# Patient Record
Sex: Female | Born: 1990 | Race: Black or African American | Hispanic: No | Marital: Single | Smoking: Never smoker
Health system: Southern US, Community
[De-identification: ages and names within clinical notes are randomized; demographics above are authoritative.]

## PROBLEM LIST (undated history)

## (undated) DIAGNOSIS — J45909 Unspecified asthma, uncomplicated: Secondary | ICD-10-CM

---

## 2012-09-27 ENCOUNTER — Emergency Department (HOSPITAL_COMMUNITY): Payer: Medicaid - Out of State

## 2012-09-27 ENCOUNTER — Encounter (HOSPITAL_COMMUNITY): Payer: Self-pay

## 2012-09-27 ENCOUNTER — Emergency Department (HOSPITAL_COMMUNITY)
Admission: EM | Admit: 2012-09-27 | Discharge: 2012-09-28 | Disposition: A | Discharge status: Home / Self Care | Payer: Medicaid - Out of State | Attending: Emergency Medicine | Admitting: Emergency Medicine

## 2012-09-27 DIAGNOSIS — Z3202 Encounter for pregnancy test, result negative: Secondary | ICD-10-CM | POA: Insufficient documentation

## 2012-09-27 DIAGNOSIS — S39012A Strain of muscle, fascia and tendon of lower back, initial encounter: Secondary | ICD-10-CM

## 2012-09-27 DIAGNOSIS — Z79899 Other long term (current) drug therapy: Secondary | ICD-10-CM | POA: Insufficient documentation

## 2012-09-27 DIAGNOSIS — J45909 Unspecified asthma, uncomplicated: Secondary | ICD-10-CM | POA: Insufficient documentation

## 2012-09-27 DIAGNOSIS — X58XXXA Exposure to other specified factors, initial encounter: Secondary | ICD-10-CM | POA: Insufficient documentation

## 2012-09-27 DIAGNOSIS — Y929 Unspecified place or not applicable: Secondary | ICD-10-CM | POA: Insufficient documentation

## 2012-09-27 DIAGNOSIS — Y939 Activity, unspecified: Secondary | ICD-10-CM | POA: Insufficient documentation

## 2012-09-27 DIAGNOSIS — S335XXA Sprain of ligaments of lumbar spine, initial encounter: Secondary | ICD-10-CM | POA: Insufficient documentation

## 2012-09-27 HISTORY — DX: Unspecified asthma, uncomplicated: J45.909

## 2012-09-27 LAB — POCT PREGNANCY, URINE: Preg Test, Ur: NEGATIVE

## 2012-09-27 MED ORDER — PREDNISONE 20 MG PO TABS
60.0000 mg | ORAL_TABLET | Freq: Once | ORAL | Status: AC
  Administered 2012-09-27: 60 mg via ORAL
  Filled 2012-09-27: qty 3

## 2012-09-27 MED ORDER — HYDROCODONE-ACETAMINOPHEN 5-325 MG PO TABS: 1.0000 | ORAL_TABLET | Freq: Four times a day (QID) | ORAL | Status: DC | PRN

## 2012-09-27 MED ORDER — OXYCODONE-ACETAMINOPHEN 5-325 MG PO TABS
1.0000 | ORAL_TABLET | Freq: Once | ORAL | Status: AC
  Administered 2012-09-27: 1 via ORAL
  Filled 2012-09-27: qty 1

## 2012-09-27 MED ORDER — PREDNISONE 50 MG PO TABS: 50.0000 mg | ORAL_TABLET | Freq: Every day | ORAL | Status: DC

## 2012-09-27 NOTE — ED Provider Notes (Signed)
History     CSN: [id number]  Arrival date & time 09/27/12  2013   First MD Initiated Contact with Patient 09/27/12 2209      Chief Complaint  Patient presents with  . Back Pain    (Consider location/radiation/quality/duration/timing/severity/associated sxs/prior treatment) Patient is a 22 y.o. female presenting with back pain.  Back Pain    Patient is a 22 year old female with a chief complaint of low back pain.  For the past year the patient has had back back diffusely of her entire spine which she has seen a doctor for in the past that has told her to sleep on a traction bed.  Over the past two days the low back pain has become severe.  No recent trauma or change in behavior.  No fever, weight loss or night sweats.  Pain occurs mostly with movement is is alleviated by resting in one position.  Not any more severe at a certain time of day.  Patient denies abdominal pain, chest pain, weakness, nausea, vomiting, SOB, fever, headache or neck pain.  Past Medical History  Diagnosis Date  . Asthma     History reviewed. No pertinent past surgical history.  History reviewed. No pertinent family history.  History  Substance Use Topics  . Smoking status: Not on file  . Smokeless tobacco: Not on file  . Alcohol Use: No    OB History    Grav Para Term Preterm Abortions TAB SAB Ect Mult Living                  Review of Systems  Musculoskeletal: Positive for back pain.   All other systems negative except as documented in the HPI. All pertinent positives and negatives as reviewed in the HPI.  Allergies  Review of patient's allergies indicates no known allergies.  Home Medications   Current Outpatient Rx  Name  Route  Sig  Dispense  Refill  . ALBUTEROL SULFATE HFA 108 (90 BASE) MCG/ACT IN AERS   Inhalation   Inhale 2 puffs into the lungs every 6 (six) hours as needed. Asthma         . LORATADINE 10 MG PO TABS   Oral   Take 10 mg by mouth daily.           BP  130/86  Pulse 93  Temp 98.5 F (36.9 C) (Oral)  Resp 20  SpO2 100%  LMP 09/22/2012  Physical Exam  Nursing note and vitals reviewed. Constitutional: She is oriented to person, place, and time. She appears well-developed and well-nourished.  HENT:  Head: Normocephalic and atraumatic.  Mouth/Throat: Oropharynx is clear and moist.  Eyes: Pupils are equal, round, and reactive to light.  Neck: Normal range of motion. Neck supple.  Cardiovascular: Normal rate, regular rhythm and normal heart sounds.   Pulmonary/Chest: Effort normal and breath sounds normal.  Musculoskeletal:       Lumbar back: She exhibits tenderness. She exhibits normal range of motion.       Back:  Neurological: She is alert and oriented to person, place, and time. She has normal reflexes. No sensory deficit. She exhibits normal muscle tone. Coordination normal.  Skin: Skin is warm and dry. No rash noted.  Psychiatric: She has a normal mood and affect.    ED Course  Procedures (including critical care time)  The patient is advised to return here as needed. The patient has normal gait and reflexes MDM  Carlyle Dolly, PA-C 09/30/12 [phone]

## 2012-09-27 NOTE — ED Notes (Signed)
Pt complains of back pain for one year, now worse, states that she cracks her back often and it feels better, no injury

## 2012-10-01 NOTE — ED Provider Notes (Signed)
Medical screening examination/treatment/procedure(s) were performed by non-physician practitioner and as supervising physician I was immediately available for consultation/collaboration. Talibah Colasurdo, MD, FACEP   Sequan Auxier L Lekisha Mcghee, MD 10/01/12 1509 

## 2013-09-23 ENCOUNTER — Emergency Department (HOSPITAL_COMMUNITY)
Admission: EM | Admit: 2013-09-23 | Discharge: 2013-09-23 | Disposition: A | Discharge status: Home / Self Care | Payer: Medicaid - Out of State | Attending: Emergency Medicine | Admitting: Emergency Medicine

## 2013-09-23 ENCOUNTER — Encounter (HOSPITAL_COMMUNITY): Payer: Self-pay | Admitting: Emergency Medicine

## 2013-09-23 DIAGNOSIS — J45909 Unspecified asthma, uncomplicated: Secondary | ICD-10-CM | POA: Insufficient documentation

## 2013-09-23 DIAGNOSIS — L0231 Cutaneous abscess of buttock: Secondary | ICD-10-CM | POA: Insufficient documentation

## 2013-09-23 DIAGNOSIS — IMO0002 Reserved for concepts with insufficient information to code with codable children: Secondary | ICD-10-CM | POA: Insufficient documentation

## 2013-09-23 DIAGNOSIS — Z79899 Other long term (current) drug therapy: Secondary | ICD-10-CM | POA: Insufficient documentation

## 2013-09-23 DIAGNOSIS — L03317 Cellulitis of buttock: Principal | ICD-10-CM

## 2013-09-23 MED ORDER — SULFAMETHOXAZOLE-TRIMETHOPRIM 800-160 MG PO TABS: 2.0000 | ORAL_TABLET | Freq: Two times a day (BID) | ORAL | Status: AC

## 2013-09-23 MED ORDER — HYDROCODONE-ACETAMINOPHEN 5-325 MG PO TABS: 1.0000 | ORAL_TABLET | ORAL | Status: DC | PRN

## 2013-09-23 NOTE — Discharge Instructions (Signed)
Read the information below.  Use the prescribed medication as directed.  Please discuss all new medications with your pharmacist.  Do not take additional tylenol while taking the prescribed pain medication to avoid overdose.  You may return to the Emergency Department at any time for worsening condition or any new symptoms that concern you.  Please soak in a warm bath several times a day or use warm moist compresses every 2 hours to encourage drainage. If you develop redness, increased swelling, uncontrolled pain, or fevers greater than 100.4, return to the ER immediately for a recheck.     Abscess An abscess is an infected area that contains a collection of pus and debris.It can occur in almost any part of the body. An abscess is also known as a furuncle or boil. CAUSES  An abscess occurs when tissue gets infected. This can occur from blockage of oil or sweat glands, infection of hair follicles, or a minor injury to the skin. As the body tries to fight the infection, pus collects in the area and creates pressure under the skin. This pressure causes pain. People with weakened immune systems have difficulty fighting infections and get certain abscesses more often.  SYMPTOMS Usually an abscess develops on the skin and becomes a painful mass that is red, warm, and tender. If the abscess forms under the skin, you may feel a moveable soft area under the skin. Some abscesses break open (rupture) on their own, but most will continue to get worse without care. The infection can spread deeper into the body and eventually into the bloodstream, causing you to feel ill.  DIAGNOSIS  Your caregiver will take your medical history and perform a physical exam. A sample of fluid may also be taken from the abscess to determine what is causing your infection. TREATMENT  Your caregiver may prescribe antibiotic medicines to fight the infection. However, taking antibiotics alone usually does not cure an abscess. Your  caregiver may need to make a small cut (incision) in the abscess to drain the pus. In some cases, gauze is packed into the abscess to reduce pain and to continue draining the area. HOME CARE INSTRUCTIONS   Only take over-the-counter or prescription medicines for pain, discomfort, or fever as directed by your caregiver.  If you were prescribed antibiotics, take them as directed. Finish them even if you start to feel better.  If gauze is used, follow your caregiver's directions for changing the gauze.  To avoid spreading the infection:  Keep your draining abscess covered with a bandage.  Wash your hands well.  Do not share personal care items, towels, or whirlpools with others.  Avoid skin contact with others.  Keep your skin and clothes clean around the abscess.  Keep all follow-up appointments as directed by your caregiver. SEEK MEDICAL CARE IF:   You have increased pain, swelling, redness, fluid drainage, or bleeding.  You have muscle aches, chills, or a general ill feeling.  You have a fever. MAKE SURE YOU:   Understand these instructions.  Will watch your condition.  Will get help right away if you are not doing well or get worse. Document Released: 05/18/2005 Document Revised: 02/07/2012 Document Reviewed: 10/21/2011 [facility] Patient Information 2014 [place], [place].   Emergency Department Resource Guide 1) Find a Doctor and Pay Out of Pocket Although you won't have to find out who is covered by your insurance plan, it is a good idea to ask around and get recommendations. You will then need to call  the office and see if the doctor you have chosen will accept you as a new patient and what types of options they offer for patients who are self-pay. Some doctors offer discounts or will set up payment plans for their patients who do not have insurance, but you will need to ask so you aren't surprised when you get to your appointment.  2) Contact Your Local Health  Department Not all health departments have doctors that can see patients for sick visits, but many do, so it is worth a call to see if yours does. If you don't know where your local health department is, you can check in your phone book. The CDC also has a tool to help you locate your state's health department, and many state websites also have listings of all of their local health departments.  3) Find a [facility] If your illness is not likely to be very severe or complicated, you may want to try a walk in clinic. These are popping up all over the country in pharmacies, drugstores, and shopping centers. They're usually staffed by nurse practitioners or physician assistants that have been trained to treat common illnesses and complaints. They're usually fairly quick and inexpensive. However, if you have serious medical issues or chronic medical problems, these are probably not your best option.  No Primary Care Doctor: - Call Health Connect at  [phone] - they can help you locate a primary care doctor that  accepts your insurance, provides certain services, etc. - Physician Referral Service- [phone]  Chronic Pain Problems: Organization         Address  Phone   Notes  [facility]  [phone] Patients need to be referred by their primary care doctor.   Medication Assistance: Organization         Address  Phone   Notes  [facility] Medication [facility] [address] [place], [place] [address] [phone] --Must be a resident of [facility] -- Must have NO insurance coverage whatsoever (no Medicaid/ Medicare, etc.) -- The pt. MUST have a primary care doctor that directs their care regularly and follows them in the community   MedAssist  [phone]   [employer]  [phone]    Agencies that provide inexpensive medical care: Organization         Address  Phone   Notes  [place]  [phone]   [facility]    [phone]   [facility] [place], [place] [address] [phone]   [place] [address], [place] [phone]   Planned Parenthood    [phone]   [facility]    [phone]   [place] and [place] [address], [place] Phone:  [phone], Fax:  [phone] Hours of Operation:  9 am - 6 pm, M-F.  Also accepts Medicaid/Medicare and self-pay.  [facility] for [place] [address], [address], [place] Phone: [phone], Fax: [phone]. Hours of Operation:  8:30 am - 5:30 pm, M-F.  Also accepts Medicaid and self-pay.  [facility] [place] [address], [place] Phone: [phone]   [place], [place], [place] [phone], Ext. [phone] Mondays & Thursdays: 7-9 AM.  First 15 patients are seen on a first come, first serve basis.  [place] Providers:  Organization         Address  Phone   Notes  [facility] [address], [place] [phone] Also accepts self-pay patients.  [facility] [id number] [place], [place]  [phone]   [place], [address], [place] [phone]   [facility] [address] [place] [phone]   Lucianne Lei [address], [place]   [phone] Only accepts [place] Access [place] patients after they have their name applied to their card.   Self-Pay (no insurance) in [facility]:  Organization         Address  Phone   Notes  Sickle Cell Patients, [facility] Internal Medicine [place] [phone]   [facility]gent Care [place] [phone]   [facility] [place]  [place], [place],  [place] [phone]   [facility]/Dr. Osei-Bonsu  [address] [place] or [address], [place] [phone] Phone number for both [place] and [place] locations is the same.  Urgent Medical and [facility] [address] [place] [phone]   [facility] [address] [place] or [address] [phone]   [facility] [address] [place] [phone], phone; [phone], fax Sees patients 1st and 3rd Saturday of every month.  Must not qualify for public or private insurance (i.e. Medicaid, Medicare, [place] Health Choice, Veterans' Benefits)  Household income should be no more than 200% of the poverty level The clinic cannot treat you if you are pregnant or think you are pregnant  Sexually transmitted diseases are not treated at the clinic.    Dental Care: Organization         Address  Phone  Notes  [facility] Department of [facility] [place] [phone] Accepts children up to age 29 who are enrolled in [place] or [place]; pregnant women with a Medicaid card; and children who have applied for Medicaid or [place] Health Choice, but were declined, whose parents can pay a reduced fee at time of service.  [facility] Department of [facility]  [address], [place] [phone] Accepts children up to age 61 who are enrolled in [place] or [place]; pregnant women with a Medicaid card; and children who have applied for Medicaid or [place] Health Choice, but were declined, whose parents can pay a reduced fee at time of service.  [place] Adult Dental Access PROGRAM  [place] [phone] Patients are seen by appointment only. Walk-ins are not accepted. [place] will see patients 85 years of age and older. Monday - Tuesday (8am-5pm) Most Wednesdays  (8:30-5pm) $30 per visit, cash only  [facility] Adult Dental Access PROGRAM  [address], [facility] [phone] Patients are seen by appointment only. Walk-ins are not accepted. [place] will see patients 52 years of age and older. One Wednesday Evening (Monthly: Volunteer Based).  $30 per visit, cash only  [place]  [phone] for adults; Children under age 67, call Graduate Pediatric Dentistry at [phone]. Children aged 65-14, please call [phone] to request a  pediatric application.  Dental services are provided in all areas of dental care including fillings, crowns and bridges, complete and partial dentures, implants, gum treatment, root canals, and extractions. Preventive care is also provided. Treatment is provided to both adults and children. Patients are selected via a lottery and there is often a waiting list.   [facility] [address] [place]  [phone] [email or web address]   [facility] [address] [place], [place] [phone], Ext. [phone] Second and Fourth Thursday of each month, opens at 6:30 AM; Clinic ends at 9 AM.  Patients are seen on a first-come first-served basis, and a limited number are seen during each clinic.   [facility]  [address] Hillard Danker [place], [place] [phone]   Eligibility Requirements You must have lived in [place], [place], or [place] counties for at least the last three months.   You cannot be eligible for state or federal sponsored [employer], including [employer], [place], or [employer].   You generally cannot be eligible for healthcare insurance through your employer.    How to apply: Eligibility screenings are held every Tuesday and Wednesday afternoon from 1:00 pm until 4:00 pm. You do not need an appointment for the interview!  [facility] [address], [place], [place]   [place]  [place] Department  [place]  [phone]    Behavioral Health Resources in the Community: Intensive Outpatient Programs Organization         Address  Phone  Notes  [place]. [address], [place], [place] [phone]   [facility] Outpatient [address] [place], [place]   ADS: Alcohol & Drug Svcs [address] [place], [place]   [place] [address]  [place], [place] or [phone]   Substance Abuse Resources Organization         Address  Phone  Notes  Alcohol and Drug Services  [phone]   [place]  [phone]   The [place]   Chinita Pester  [phone]   Residential & Outpatient Substance Abuse Program  [phone]   Psychological Services Organization         Address  Phone  Notes  [facility]  [place]  [phone]   [place] [address], [place] or [phone]    Mobile Crisis Teams Organization         Address  Phone  Notes  [facility], Mobile Crisis Care Unit  [phone]   [facility]ive Psychotherapeutic Services  [address] [place], [place]   [place] Levels [address], [place] [phone]    Self-Help/Support Groups Organization         Address  Phone             Notes  [place]. of [place] - variety of support groups  [place] Call for more information  Narcotics Anonymous (NA), Caring Services [address], [employer] [place]  2 meetings at this location   Special educational needs teacher         Address  Phone  Notes  [facility]AP Residential Treatment [place],    [place]  Tatym Schermer [place]  [place], [place],  [place], [place]   [place], [place] [phone] Admissions: 8am-3pm M-F  Incentives Substance [place] [address],    [place], [place] [phone]   [facility] [address] [place], [place], [place]   [facility] [address]  [place], [place]   Insight Programs - Intensive Outpatient [address], [place], [place]   [facility] [place].,  [place], [place] [phone] or [phone]   Residential Treatment Services (RTS) [address], [place], [place] Accepts Medicaid  [facility] [place] [address],  [place] [phone] Substance Abuse/Addiction Treatment   [facility] Organization         Address  Phone  Notes  [facility]  [phone]   Domenic Schwab, PhD [address] Arlis Porta [place], [place]   [phone] or [phone]   Gandy Rotonda [place], [place] [phone]   [facility] [address] [place], [place] [phone] Insurance/Medicaid/sponsorship through [facility] and Families [address], [address] [place]                                    [place], [place] [phone] [place] [address] [place], [place] [phone]    Dr. Adele Schilder  [phone]   [facility] Dept. 1) [address] [place] 2) [place] 3)  [place] [address], [place] [phone]  [phone]   [place] [phone] or [phone] (After Hours)

## 2013-09-23 NOTE — Progress Notes (Signed)
   CARE MANAGEMENT ED NOTE 09/23/2013  Patient:  Shonna ChockWILLIAMS-HILL,Bracha   Account Number:  [id number]  Date Initiated:  09/23/2013  Documentation initiated by:  Edd ArbourGIBBS,Benjaman Artman  Subjective/Objective Assessment:   23 yr old medicaid out of state [place]     Subjective/Objective Assessment Detail:     Action/Plan:   EPIC updated   Action/Plan Detail:   Anticipated DC Date:  09/23/2013     Status Recommendation to Physician:   Result of Recommendation:    Other ED Services  Consult Working Plan    DC Planning Services  Outpatient Services - Pt will follow up  PCP issues  Other    Choice offered to / List presented to:            Status of service:  Completed, signed off  ED Comments:   ED Comments Detail:

## 2013-09-23 NOTE — ED Provider Notes (Signed)
CSN: [id number]     Arrival date & time 09/23/13  1400 History  This chart was scribed for non-physician practitioner, Trixie Dredge, PA-C working with Junius Argyle, MD by Greggory Stallion, ED scribe. This patient was seen in room WTR7/WTR7 and the patient's care was started at 2:26 PM.   Chief Complaint  Patient presents with  . Abscess   The history is provided by the patient. No language interpreter was used.   HPI Comments: Melissa Acosta is a 23 y.o. female who presents to the Emergency Department complaining of gradual onset, worsening tailbone pain that started 4 days ago. Pt states she has been doing sit ups on a hard surface and thinks that is was caused the pain. She states she started feeling a lump but denies any discharge. Pt has used generic icy hot with no relief. Movement or sitting down worsens the pain. Denies fever, chills, generalized body aches, constipation, bowel or bladder incontinence, weakness or numbness in legs, saddle anesthesia.   Past Medical History  Diagnosis Date  . Asthma    No past surgical history on file. No family history on file. History  Substance Use Topics  . Smoking status: Not on file  . Smokeless tobacco: Not on file  . Alcohol Use: No   OB History   Grav Para Term Preterm Abortions TAB SAB Ect Mult Living                 Review of Systems  Constitutional: Negative for fever and chills.  Gastrointestinal: Negative for constipation.  Genitourinary:       Negative for bowel or bladder incontinence.   Musculoskeletal: Positive for myalgias (tailbone).  Neurological: Negative for weakness and numbness.  All other systems reviewed and are negative.    Allergies  Review of patient's allergies indicates no known allergies.  Home Medications   Current Outpatient Rx  Name  Route  Sig  Dispense  Refill  . albuterol (PROVENTIL HFA;VENTOLIN HFA) 108 (90 BASE) MCG/ACT inhaler   Inhalation   Inhale 2 puffs into the lungs every 6  (six) hours as needed. Asthma         . HYDROcodone-acetaminophen (NORCO/VICODIN) 5-325 MG per tablet   Oral   Take 1 tablet by mouth every 6 (six) hours as needed for pain.   15 tablet   0   . loratadine (CLARITIN) 10 MG tablet   Oral   Take 10 mg by mouth daily.         . predniSONE (DELTASONE) 50 MG tablet   Oral   Take 1 tablet (50 mg total) by mouth daily.   5 tablet   0    BP 131/83  Pulse 89  Temp(Src) 98.1 F (36.7 C) (Oral)  Resp 18  SpO2 99%  LMP 09/14/2013  Physical Exam  Nursing note and vitals reviewed. Constitutional: She appears well-developed and well-nourished. No distress.  HENT:  Head: Normocephalic and atraumatic.  Neck: Neck supple.  Pulmonary/Chest: Effort normal.  Neurological: She is alert.  Skin: She is not diaphoretic.  Small induration and mild erythema of left buttock adjacent to pilonidal area.     ED Course  Procedures (including critical care time)  DIAGNOSTIC STUDIES: Oxygen Saturation is 99% on RA, normal by my interpretation.    COORDINATION OF CARE: 2:31 PM-Discussed treatment plan which includes ultrasound with pt at bedside and pt agreed to plan.  2:39 PM-Ultrasound preformed. Pt will be discharged with pain medication and antibiotics. Advised her  to return if abscess does not resolve.   Labs Review Labs Reviewed - No data to display Imaging Review No results found.  EKG Interpretation   None      Bedside ultrasound without apparent fluid collection.  MDM   1. Left buttock abscess    Pt with early abscess of left buttock vs pilonidal area.  No apparent abscess on bedside ultrasound.  Discussed options with patient - pt to d/c home with bactrim, norco, warm compresses/warm soaks.  Pt understands she may need to return for I&D.  I do not think I&D would be beneficial at this time. Discussed findings, treatment, and follow up  with patient.  Pt given return precautions.  Pt verbalizes understanding and agrees with  plan.      I personally performed the services described in this documentation, which was scribed in my presence. The recorded information has been reviewed and is accurate.   Trixie Dredgemily Ayris Carano, PA-C 09/23/13 1827

## 2013-09-23 NOTE — ED Notes (Signed)
Pt states she was working out Thursday, doing sit ups and crunches and felt some mild pain in tailbone area. Pt states she started feeling 10/10 pain in tailbone area over the weekend. Pt states she applied icyhot to affected area, which did not provide relief. Pt denies redness or swelling in affected area.

## 2013-09-27 NOTE — ED Provider Notes (Signed)
Medical screening examination/treatment/procedure(s) were performed by non-physician practitioner and as supervising physician I was immediately available for consultation/collaboration.  Toy Eisemann M Kahlen Morais, MD 09/27/13 1956 

## 2013-10-11 ENCOUNTER — Encounter (HOSPITAL_COMMUNITY): Payer: Self-pay | Admitting: Emergency Medicine

## 2013-10-11 ENCOUNTER — Emergency Department (HOSPITAL_COMMUNITY)
Admission: EM | Admit: 2013-10-11 | Discharge: 2013-10-11 | Disposition: A | Discharge status: Home / Self Care | Payer: Medicaid - Out of State | Attending: Emergency Medicine | Admitting: Emergency Medicine

## 2013-10-11 ENCOUNTER — Emergency Department (HOSPITAL_COMMUNITY): Payer: Medicaid - Out of State

## 2013-10-11 DIAGNOSIS — J45909 Unspecified asthma, uncomplicated: Secondary | ICD-10-CM | POA: Insufficient documentation

## 2013-10-11 DIAGNOSIS — J069 Acute upper respiratory infection, unspecified: Secondary | ICD-10-CM | POA: Insufficient documentation

## 2013-10-11 DIAGNOSIS — R110 Nausea: Secondary | ICD-10-CM

## 2013-10-11 DIAGNOSIS — Z79899 Other long term (current) drug therapy: Secondary | ICD-10-CM | POA: Insufficient documentation

## 2013-10-11 DIAGNOSIS — Z3202 Encounter for pregnancy test, result negative: Secondary | ICD-10-CM | POA: Insufficient documentation

## 2013-10-11 DIAGNOSIS — R112 Nausea with vomiting, unspecified: Secondary | ICD-10-CM | POA: Insufficient documentation

## 2013-10-11 LAB — URINALYSIS, ROUTINE W REFLEX MICROSCOPIC
BILIRUBIN URINE: NEGATIVE
Glucose, UA: NEGATIVE mg/dL
Ketones, ur: NEGATIVE mg/dL
Leukocytes, UA: NEGATIVE
Nitrite: NEGATIVE
PROTEIN: NEGATIVE mg/dL
Specific Gravity, Urine: 1.027 (ref 1.005–1.030)
UROBILINOGEN UA: 1 mg/dL (ref 0.0–1.0)
pH: 6 (ref 5.0–8.0)

## 2013-10-11 LAB — PREGNANCY, URINE: Preg Test, Ur: NEGATIVE

## 2013-10-11 LAB — URINE MICROSCOPIC-ADD ON

## 2013-10-11 MED ORDER — ONDANSETRON 4 MG PO TBDP: 4.0000 mg | ORAL_TABLET | Freq: Three times a day (TID) | ORAL | Status: DC | PRN

## 2013-10-11 MED ORDER — ONDANSETRON 4 MG PO TBDP
4.0000 mg | ORAL_TABLET | Freq: Once | ORAL | Status: AC
  Administered 2013-10-11: 4 mg via ORAL
  Filled 2013-10-11: qty 1

## 2013-10-11 NOTE — Discharge Instructions (Signed)
Nausea and Vomiting Nausea is a sick feeling that often comes before throwing up (vomiting). Vomiting is a reflex where stomach contents come out of your mouth. Vomiting can cause severe loss of body fluids (dehydration). Children and elderly adults can become dehydrated quickly, especially if they also have diarrhea. Nausea and vomiting are symptoms of a condition or disease. It is important to find the cause of your symptoms. CAUSES   Direct irritation of the stomach lining. This irritation can result from increased acid production (gastroesophageal reflux disease), infection, food poisoning, taking certain medicines (such as nonsteroidal anti-inflammatory drugs), alcohol use, or tobacco use.  Signals from the brain.These signals could be caused by a headache, heat exposure, an inner ear disturbance, increased pressure in the brain from injury, infection, a tumor, or a concussion, pain, emotional stimulus, or metabolic problems.  An obstruction in the gastrointestinal tract (bowel obstruction).  Illnesses such as diabetes, hepatitis, gallbladder problems, appendicitis, kidney problems, cancer, sepsis, atypical symptoms of a heart attack, or eating disorders.  Medical treatments such as chemotherapy and radiation.  Receiving medicine that makes you sleep (general anesthetic) during surgery. DIAGNOSIS Your caregiver may ask for tests to be done if the problems do not improve after a few days. Tests may also be done if symptoms are severe or if the reason for the nausea and vomiting is not clear. Tests may include:  Urine tests.  Blood tests.  Stool tests.  Cultures (to look for evidence of infection).  X-rays or other imaging studies. Test results can help your caregiver make decisions about treatment or the need for additional tests. TREATMENT You need to stay well hydrated. Drink frequently but in small amounts.You may wish to drink water, sports drinks, clear broth, or eat frozen  ice pops or gelatin dessert to help stay hydrated.When you eat, eating slowly may help prevent nausea.There are also some antinausea medicines that may help prevent nausea. HOME CARE INSTRUCTIONS   Take all medicine as directed by your caregiver.  If you do not have an appetite, do not force yourself to eat. However, you must continue to drink fluids.  If you have an appetite, eat a normal diet unless your caregiver tells you differently.  Eat a variety of complex carbohydrates (rice, wheat, potatoes, bread), lean meats, yogurt, fruits, and vegetables.  Avoid high-fat foods because they are more difficult to digest.  Drink enough water and fluids to keep your urine clear or pale yellow.  If you are dehydrated, ask your caregiver for specific rehydration instructions. Signs of dehydration may include:  Severe thirst.  Dry lips and mouth.  Dizziness.  Dark urine.  Decreasing urine frequency and amount.  Confusion.  Rapid breathing or pulse. SEEK IMMEDIATE MEDICAL CARE IF:   You have blood or brown flecks (like coffee grounds) in your vomit.  You have black or bloody stools.  You have a severe headache or stiff neck.  You are confused.  You have severe abdominal pain.  You have chest pain or trouble breathing.  You do not urinate at least once every 8 hours.  You develop cold or clammy skin.  You continue to vomit for longer than 24 to 48 hours.  You have a fever. MAKE SURE YOU:   Understand these instructions.  Will watch your condition.  Will get help right away if you are not doing well or get worse. Document Released: 08/08/2005 Document Revised: 10/31/2011 Document Reviewed: 01/05/2011 [facility] Patient Information 2014 [place], [place].  Upper Respiratory Infection, Adult  An upper respiratory infection (URI) is also sometimes known as the common cold. The upper respiratory tract includes the nose, sinuses, throat, trachea, and bronchi. Bronchi are the  airways leading to the lungs. Most people improve within 1 week, but symptoms can last up to 2 weeks. A residual cough may last even longer.  CAUSES Many different viruses can infect the tissues lining the upper respiratory tract. The tissues become irritated and inflamed and often become very moist. Mucus production is also common. A cold is contagious. You can easily spread the virus to others by oral contact. This includes kissing, sharing a glass, coughing, or sneezing. Touching your mouth or nose and then touching a surface, which is then touched by another person, can also spread the virus. SYMPTOMS  Symptoms typically develop 1 to 3 days after you come in contact with a cold virus. Symptoms vary from person to person. They may include:  Runny nose.  Sneezing.  Nasal congestion.  Sinus irritation.  Sore throat.  Loss of voice (laryngitis).  Cough.  Fatigue.  Muscle aches.  Loss of appetite.  Headache.  Low-grade fever. DIAGNOSIS  You might diagnose your own cold based on familiar symptoms, since most people get a cold 2 to 3 times a year. Your caregiver can confirm this based on your exam. Most importantly, your caregiver can check that your symptoms are not due to another disease such as strep throat, sinusitis, pneumonia, asthma, or epiglottitis. Blood tests, throat tests, and X-rays are not necessary to diagnose a common cold, but they may sometimes be helpful in excluding other more serious diseases. Your caregiver will decide if any further tests are required. RISKS AND COMPLICATIONS  You may be at risk for a more severe case of the common cold if you smoke cigarettes, have chronic heart disease (such as heart failure) or lung disease (such as asthma), or if you have a weakened immune system. The very young and very old are also at risk for more serious infections. Bacterial sinusitis, middle ear infections, and bacterial pneumonia can complicate the common cold. The common  cold can worsen asthma and chronic obstructive pulmonary disease (COPD). Sometimes, these complications can require emergency medical care and may be life-threatening. PREVENTION  The best way to protect against getting a cold is to practice good hygiene. Avoid oral or hand contact with people with cold symptoms. Wash your hands often if contact occurs. There is no clear evidence that vitamin C, vitamin E, echinacea, or exercise reduces the chance of developing a cold. However, it is always recommended to get plenty of rest and practice good nutrition. TREATMENT  Treatment is directed at relieving symptoms. There is no cure. Antibiotics are not effective, because the infection is caused by a virus, not by bacteria. Treatment may include:  Increased fluid intake. Sports drinks offer valuable electrolytes, sugars, and fluids.  Breathing heated mist or steam (vaporizer or shower).  Eating chicken soup or other clear broths, and maintaining good nutrition.  Getting plenty of rest.  Using gargles or lozenges for comfort.  Controlling fevers with ibuprofen or acetaminophen as directed by your caregiver.  Increasing usage of your inhaler if you have asthma. Zinc gel and zinc lozenges, taken in the first 24 hours of the common cold, can shorten the duration and lessen the severity of symptoms. Pain medicines may help with fever, muscle aches, and throat pain. A variety of non-prescription medicines are available to treat congestion and runny nose. Your caregiver can make recommendations  and may suggest nasal or lung inhalers for other symptoms.  HOME CARE INSTRUCTIONS   Only take over-the-counter or prescription medicines for pain, discomfort, or fever as directed by your caregiver.  Use a warm mist humidifier or inhale steam from a shower to increase air moisture. This may keep secretions moist and make it easier to breathe.  Drink enough water and fluids to keep your urine clear or pale  yellow.  Rest as needed.  Return to work when your temperature has returned to normal or as your caregiver advises. You may need to stay home longer to avoid infecting others. You can also use a face mask and careful hand washing to prevent spread of the virus. SEEK MEDICAL CARE IF:   After the first few days, you feel you are getting worse rather than better.  You need your caregiver's advice about medicines to control symptoms.  You develop chills, worsening shortness of breath, or brown or red sputum. These may be signs of pneumonia.  You develop yellow or brown nasal discharge or pain in the face, especially when you bend forward. These may be signs of sinusitis.  You develop a fever, swollen neck glands, pain with swallowing, or white areas in the back of your throat. These may be signs of strep throat. SEEK IMMEDIATE MEDICAL CARE IF:   You have a fever.  You develop severe or persistent headache, ear pain, sinus pain, or chest pain.  You develop wheezing, a prolonged cough, cough up blood, or have a change in your usual mucus (if you have chronic lung disease).  You develop sore muscles or a stiff neck. Document Released: 02/01/2001 Document Revised: 10/31/2011 Document Reviewed: 12/10/2010 [facility] Patient Information 2014 [place], [place].

## 2013-10-11 NOTE — ED Notes (Signed)
Pt c/o nasal congestion and cough x 2 weeks and vomiting x 3 days.  Denies pain.  Pt reports that she has taken several OTC medication w/o relief.

## 2013-10-11 NOTE — ED Provider Notes (Signed)
CSN: [id number]     Arrival date & time 10/11/13  1230 History   First MD Initiated Contact with Patient 10/11/13 1302     Chief Complaint  Patient presents with  . Nasal Congestion  . Emesis  . Cough      HPI  Patient presents with "really sick".  Runny nose and a cough and sinus congestion for last week.  She then started vomiting 2 days ago as well as yesterday vomited today and presents here denies urinary symptoms of a pulmonary symptoms denies pregnancy.  Past Medical History  Diagnosis Date  . Asthma    History reviewed. No pertinent past surgical history. History reviewed. No pertinent family history. History  Substance Use Topics  . Smoking status: Never Smoker   . Smokeless tobacco: Not on file  . Alcohol Use: Yes     Comment: occ   OB History   Grav Para Term Preterm Abortions TAB SAB Ect Mult Living                 Review of Systems  Constitutional: Negative for fever, chills, diaphoresis, appetite change and fatigue.  HENT: Positive for congestion, postnasal drip and rhinorrhea. Negative for mouth sores, sore throat and trouble swallowing.   Eyes: Negative for visual disturbance.  Respiratory: Negative for cough, chest tightness, shortness of breath and wheezing.   Cardiovascular: Negative for chest pain.  Gastrointestinal: Positive for nausea and vomiting. Negative for abdominal pain, diarrhea and abdominal distention.  Endocrine: Negative for polydipsia, polyphagia and polyuria.  Genitourinary: Negative for dysuria, frequency and hematuria.  Musculoskeletal: Negative for gait problem.  Skin: Negative for color change, pallor and rash.  Neurological: Negative for dizziness, syncope, light-headedness and headaches.  Hematological: Does not bruise/bleed easily.  Psychiatric/Behavioral: Negative for behavioral problems and confusion.      Allergies  Review of patient's allergies indicates no known allergies.  Home Medications   Current Outpatient Rx   Name  Route  Sig  Dispense  Refill  . albuterol (PROVENTIL HFA;VENTOLIN HFA) 108 (90 BASE) MCG/ACT inhaler   Inhalation   Inhale 2 puffs into the lungs every 6 (six) hours as needed. Asthma         . Camphor-Eucalyptus-Menthol (VICKS VAPORUB EX)   Apply externally   Apply 1 application topically every 2 (two) hours as needed (congestion).         . DM-Doxylamine-Acetaminophen (NYQUIL COLD & FLU PO)   Oral   Take 15-30 mLs by mouth every 4 (four) hours as needed (cold like symptoms).         Marland Kitchen HYDROcodone-acetaminophen (NORCO/VICODIN) 5-325 MG per tablet   Oral   Take 1 tablet by mouth every 4 (four) hours as needed.   15 tablet   0   . Norethindrone-Ethinyl Estradiol-Fe Biphas (LO LOESTRIN FE) 1 MG-10 MCG / 10 MCG tablet   Oral   Take 1 tablet by mouth daily.         Marland Kitchen Phenylephrine-DM-GG-APAP (TYLENOL COLD/FLU SEVERE) 5-10-200-325 MG TABS   Oral   Take 1 tablet by mouth every 4 (four) hours as needed (cold symptoms).         . ondansetron (ZOFRAN ODT) 4 MG disintegrating tablet   Oral   Take 1 tablet (4 mg total) by mouth every 8 (eight) hours as needed for nausea.   6 tablet   0    BP 130/91  Pulse 86  Temp(Src) 98.2 F (36.8 C) (Oral)  Resp 16  Ht 5'  4" (1.626 m)  Wt 167 lb (75.751 kg)  BMI 28.65 kg/m2  SpO2 100%  LMP 09/14/2013 Physical Exam  Constitutional: She is oriented to person, place, and time. She appears well-developed and well-nourished. No distress.  HENT:  Head: Normocephalic.  Eyes: Conjunctivae are normal. Pupils are equal, round, and reactive to light. No scleral icterus.  Neck: Normal range of motion. Neck supple. No thyromegaly present.  Cardiovascular: Normal rate and regular rhythm.  Exam reveals no gallop and no friction rub.   No murmur heard. Pulmonary/Chest: Effort normal and breath sounds normal. No respiratory distress. She has no wheezes. She has no rales.  Abdominal: Soft. Bowel sounds are normal. She exhibits no  distension. There is no tenderness. There is no rebound.  Musculoskeletal: Normal range of motion.  Neurological: She is alert and oriented to person, place, and time.  Skin: Skin is warm and dry. No rash noted.  Psychiatric: She has a normal mood and affect. Her behavior is normal.    ED Course  Procedures (including critical care time) Labs Review Labs Reviewed  URINALYSIS, ROUTINE W REFLEX MICROSCOPIC - Abnormal; Notable for the following:    APPearance CLOUDY (*)    Hgb urine dipstick TRACE (*)    All other components within normal limits  URINE MICROSCOPIC-ADD ON - Abnormal; Notable for the following:    Squamous Epithelial / LPF FEW (*)    All other components within normal limits  PREGNANCY, URINE   Imaging Review Dg Chest 2 View  10/11/2013   CLINICAL DATA:  Cough and congestion  EXAM: CHEST  2 VIEW  COMPARISON:  None.  FINDINGS: Lungs are clear. Heart size and pulmonary vascularity are normal. No adenopathy. There is mild upper thoracic levoscoliosis.  IMPRESSION: No edema or consolidation.   Electronically Signed   By: Bretta BangWilliam  Woodruff M.D.   On: 10/11/2013 13:39    EKG Interpretation   None       MDM   Final diagnoses:  Nausea  Upper respiratory infection    Normal ureters normal x-ray. Normal exam. No vomiting. Plan is discharge home    Rolland PorterMark Alasha Mcguinness, MD 10/11/13 1459

## 2013-10-22 ENCOUNTER — Encounter (HOSPITAL_COMMUNITY): Payer: Self-pay | Admitting: Emergency Medicine

## 2013-10-22 ENCOUNTER — Emergency Department (HOSPITAL_COMMUNITY)
Admission: EM | Admit: 2013-10-22 | Discharge: 2013-10-23 | Disposition: A | Discharge status: Home / Self Care | Payer: Medicaid - Out of State | Attending: Emergency Medicine | Admitting: Emergency Medicine

## 2013-10-22 DIAGNOSIS — R059 Cough, unspecified: Secondary | ICD-10-CM | POA: Diagnosis present

## 2013-10-22 DIAGNOSIS — R05 Cough: Secondary | ICD-10-CM

## 2013-10-22 DIAGNOSIS — J45909 Unspecified asthma, uncomplicated: Secondary | ICD-10-CM | POA: Diagnosis not present

## 2013-10-22 DIAGNOSIS — Z79899 Other long term (current) drug therapy: Secondary | ICD-10-CM | POA: Diagnosis not present

## 2013-10-22 DIAGNOSIS — R071 Chest pain on breathing: Secondary | ICD-10-CM | POA: Insufficient documentation

## 2013-10-22 DIAGNOSIS — R0789 Other chest pain: Secondary | ICD-10-CM

## 2013-10-22 NOTE — ED Notes (Addendum)
Pt reports cough x 2 weeks that is productive and was seen here and dx with URI. Denies n/v/d and being SOB. Skin warm and dry. NAD. Alert and oriented x 4. Patient has left rib cage pain that hurts when coughing.

## 2013-10-23 ENCOUNTER — Emergency Department (HOSPITAL_COMMUNITY): Payer: Medicaid - Out of State

## 2013-10-23 LAB — CBC WITH DIFFERENTIAL/PLATELET
Basophils Absolute: 0 10*3/uL (ref 0.0–0.1)
Basophils Relative: 0 % (ref 0–1)
EOS ABS: 0.2 10*3/uL (ref 0.0–0.7)
Eosinophils Relative: 2 % (ref 0–5)
HCT: 36.7 % (ref 36.0–46.0)
HEMOGLOBIN: 12.1 g/dL (ref 12.0–15.0)
LYMPHS ABS: 3.8 10*3/uL (ref 0.7–4.0)
LYMPHS PCT: 41 % (ref 12–46)
MCH: 28.7 pg (ref 26.0–34.0)
MCHC: 33 g/dL (ref 30.0–36.0)
MCV: 87.2 fL (ref 78.0–100.0)
MONOS PCT: 6 % (ref 3–12)
Monocytes Absolute: 0.5 10*3/uL (ref 0.1–1.0)
NEUTROS PCT: 52 % (ref 43–77)
Neutro Abs: 4.8 10*3/uL (ref 1.7–7.7)
Platelets: 357 10*3/uL (ref 150–400)
RBC: 4.21 MIL/uL (ref 3.87–5.11)
RDW: 12.4 % (ref 11.5–15.5)
WBC: 9.3 10*3/uL (ref 4.0–10.5)

## 2013-10-23 LAB — BASIC METABOLIC PANEL
BUN: 11 mg/dL (ref 6–23)
CO2: 28 meq/L (ref 19–32)
Calcium: 9.4 mg/dL (ref 8.4–10.5)
Chloride: 100 mEq/L (ref 96–112)
Creatinine, Ser: 0.64 mg/dL (ref 0.50–1.10)
GFR calc Af Amer: >90 mL/min (ref >90)
GLUCOSE: 81 mg/dL (ref 70–99)
POTASSIUM: 3.4 meq/L — AB (ref 3.7–5.3)
SODIUM: 140 meq/L (ref 137–147)

## 2013-10-23 NOTE — ED Provider Notes (Signed)
CSN: [id number]     Arrival date & time 10/22/13  2247 History   First MD Initiated Contact with Patient 10/23/13 0051     Chief Complaint  Patient presents with  . Cough     (Consider location/radiation/quality/duration/timing/severity/associated sxs/prior Treatment) HPI Comments: She has had L lateral chest wall pain w/ cough for two weeks. Today she coughed and felt a "pop" in her left lateral chest.   Patient is a 23 y.o. female presenting with cough. The history is provided by the patient. No language interpreter was used.  Cough Cough characteristics:  Dry Severity:  Moderate Duration:  2 weeks Timing:  Constant Progression:  Unchanged Chronicity:  New Smoker: no   Relieved by:  Nothing Worsened by:  Nothing tried Ineffective treatments:  Rest Associated symptoms: chest pain   Associated symptoms: no chills, no diaphoresis, no eye discharge, no fever, no headaches, no myalgias, no rash, no rhinorrhea, no shortness of breath, no sinus congestion, no sore throat and no wheezing   Associated symptoms comment:  No LE edema, no SOB Chest pain:    Quality:  Sharp   Severity:  Moderate   Duration:  2 days   Timing:  Intermittent   Progression:  Worsening   Chronicity:  New Risk factors: recent infection     Past Medical History  Diagnosis Date  . Asthma    History reviewed. No pertinent past surgical history. No family history on file. History  Substance Use Topics  . Smoking status: Never Smoker   . Smokeless tobacco: Not on file  . Alcohol Use: Yes     Comment: occ   OB History   Grav Para Term Preterm Abortions TAB SAB Ect Mult Living                 Review of Systems  Constitutional: Negative for fever, chills, diaphoresis, activity change, appetite change and fatigue.  HENT: Negative for congestion, facial swelling, rhinorrhea and sore throat.   Eyes: Negative for photophobia and discharge.  Respiratory: Positive for cough. Negative for chest tightness,  shortness of breath and wheezing.   Cardiovascular: Positive for chest pain. Negative for palpitations and leg swelling.  Gastrointestinal: Negative for nausea, vomiting, abdominal pain and diarrhea.  Endocrine: Negative for polydipsia and polyuria.  Genitourinary: Negative for dysuria, frequency, difficulty urinating and pelvic pain.  Musculoskeletal: Negative for arthralgias, back pain, myalgias, neck pain and neck stiffness.  Skin: Negative for color change, rash and wound.  Allergic/Immunologic: Negative for immunocompromised state.  Neurological: Negative for facial asymmetry, weakness, numbness and headaches.  Hematological: Does not bruise/bleed easily.  Psychiatric/Behavioral: Negative for confusion and agitation.      Allergies  Review of patient's allergies indicates no known allergies.  Home Medications   Current Outpatient Rx  Name  Route  Sig  Dispense  Refill  . albuterol (PROVENTIL HFA;VENTOLIN HFA) 108 (90 BASE) MCG/ACT inhaler   Inhalation   Inhale 2 puffs into the lungs every 6 (six) hours as needed. Asthma         . Camphor-Eucalyptus-Menthol (VICKS VAPORUB EX)   Apply externally   Apply 1 application topically every 2 (two) hours as needed (congestion).         . Chlorphen-Pseudoephed-APAP (THERAFLU FLU/COLD PO)   Oral   Take 5 mLs by mouth every 8 (eight) hours as needed (cough).         . DM-Doxylamine-Acetaminophen (NYQUIL COLD & FLU PO)   Oral   Take 15-30 mLs by mouth  every 4 (four) hours as needed (cold like symptoms).         Marland Kitchen guaiFENesin (MUCINEX) 600 MG 12 hr tablet   Oral   Take 600 mg by mouth 2 (two) times daily as needed for cough or to loosen phlegm.         . Norethindrone-Ethinyl Estradiol-Fe Biphas (LO LOESTRIN FE) 1 MG-10 MCG / 10 MCG tablet   Oral   Take 1 tablet by mouth daily.         Marland Kitchen Phenylephrine-DM-GG-APAP (TYLENOL COLD/FLU SEVERE) 5-10-200-325 MG TABS   Oral   Take 1 tablet by mouth every 4 (four) hours as  needed (cold symptoms).          BP 125/83  Pulse 90  Temp(Src) 98.3 F (36.8 C) (Oral)  Resp 16  SpO2 99%  LMP 09/14/2013 Physical Exam  Constitutional: She is oriented to person, place, and time. She appears well-developed and well-nourished. No distress.  HENT:  Head: Normocephalic and atraumatic.  Mouth/Throat: No oropharyngeal exudate.  Eyes: Pupils are equal, round, and reactive to light.  Neck: Normal range of motion. Neck supple.  Cardiovascular: Normal rate, regular rhythm and normal heart sounds.  Exam reveals no gallop and no friction rub.   No murmur heard. Pulmonary/Chest: Effort normal and breath sounds normal. No respiratory distress. She has no wheezes. She has no rales. She exhibits bony tenderness.    Abdominal: Soft. Bowel sounds are normal. She exhibits no distension and no mass. There is no tenderness. There is no rebound and no guarding.  Musculoskeletal: Normal range of motion. She exhibits no edema and no tenderness.  Neurological: She is alert and oriented to person, place, and time.  Skin: Skin is warm and dry.  Psychiatric: She has a normal mood and affect.    ED Course  Procedures (including critical care time) Labs Review Labs Reviewed  BASIC METABOLIC PANEL - Abnormal; Notable for the following:    Potassium 3.4 (*)    All other components within normal limits  CBC WITH DIFFERENTIAL   Imaging Review Dg Chest 2 View  10/23/2013   CLINICAL DATA:  Shortness of breath and left anterior chest pain.  EXAM: CHEST  2 VIEW  COMPARISON:  DG CHEST 2 VIEW dated 10/11/2013  FINDINGS: Cardiomediastinal silhouette is unremarkable. Minimal strandy densities in left lung base with a mildly elevated left hemidiaphragm. The lungs are otherwise clear without pleural effusions or focal consolidations. Trachea projects midline and there is no pneumothorax. Soft tissue planes and included osseous structures are non-suspicious. Upper thoracic dextroscoliosis. Air-filled  colon.  IMPRESSION: Minimal left lung base atelectasis.   Electronically Signed   By: Awilda Metro   On: 10/23/2013 01:13     EKG Interpretation None      MDM   Final diagnoses:  Cough  Chest wall pain    Pt is a 23 y.o. female with Pmhx as above who presents with about 2 weeks of cough w/ L lateral chest wall pain only w/ cough, palpation or deep breathing. Today she coughed, felt a pop and was concerning for a rib fx. No fever, SOB, leg pain or swelling. VSS, pt in NAD.  +ttp L lateral chest wall CXR with minimal L lung base atelectasis. No bony changes. Doubt pna, PE, ACS. Suspect MSK pain due to frequent coughing. Rec scheduled NSAIDs. Return precautions given for new or worsening symptoms including fever, SOB, leg pain/swelling.          Aundra Millet E  Micheline Maze, MD 10/23/13 2047

## 2013-10-23 NOTE — Discharge Instructions (Signed)
Chest Wall Pain Chest wall pain is pain felt in or around the chest bones and muscles. It may take up to 6 weeks to get better. It may take longer if you are active. Chest wall pain can happen on its own. Other times, things like germs, injury, coughing, or exercise can cause the pain. HOME CARE   Avoid activities that make you tired or cause pain. Try not to use your chest, belly (abdominal), or side muscles. Do not use heavy weights.  Put ice on the sore area.  Put ice in a plastic bag.  Place a towel between your skin and the bag.  Leave the ice on for 15-20 minutes for the first 2 days.  Only take medicine as told by your doctor. GET HELP RIGHT AWAY IF:   You have more pain or are very uncomfortable.  You have a fever.  Your chest pain gets worse.  You have new problems.  You feel sick to your stomach (nauseous) or throw up (vomit).  You start to sweat or feel lightheaded.  You have a cough with mucus (phlegm).  You cough up blood. MAKE SURE YOU:   Understand these instructions.  Will watch your condition.  Will get help right away if you are not doing well or get worse. Document Released: 01/25/2008 Document Revised: 10/31/2011 Document Reviewed: 04/04/2011 [facility] Patient Information 2014 [place] ShoreExitCare, [place]LL[place]. Cough, Adult  A cough is a reflex that helps clear your throat and airways. It can help heal the body or may be a reaction to an irritated airway. A cough may only last 2 or 3 weeks (acute) or may last more than 8 weeks (chronic).  CAUSES Acute cough:  Viral or bacterial infections. Chronic cough:  Infections.  Allergies.  Asthma.  Post-nasal drip.  Smoking.  Heartburn or acid reflux.  Some medicines.  Chronic lung problems (COPD).  Cancer. SYMPTOMS   Cough.  Fever.  Chest pain.  Increased breathing rate.  High-pitched whistling sound when breathing (wheezing).  Colored mucus that you cough up (sputum). TREATMENT   A bacterial  cough may be treated with antibiotic medicine.  A viral cough must run its course and will not respond to antibiotics.  Your caregiver may recommend other treatments if you have a chronic cough. HOME CARE INSTRUCTIONS   Only take over-the-counter or prescription medicines for pain, discomfort, or fever as directed by your caregiver. Use cough suppressants only as directed by your caregiver.  Use a cold steam vaporizer or humidifier in your bedroom or home to help loosen secretions.  Sleep in a semi-upright position if your cough is worse at night.  Rest as needed.  Stop smoking if you smoke. SEEK IMMEDIATE MEDICAL CARE IF:   You have pus in your sputum.  Your cough starts to worsen.  You cannot control your cough with suppressants and are losing sleep.  You begin coughing up blood.  You have difficulty breathing.  You develop pain which is getting worse or is uncontrolled with medicine.  You have a fever. MAKE SURE YOU:   Understand these instructions.  Will watch your condition.  Will get help right away if you are not doing well or get worse. Document Released: 02/04/2011 Document Revised: 10/31/2011 Document Reviewed: 02/04/2011 [facility] Patient Information 2014 [place]ExitCare, MarylandLLC.

## 2014-02-21 IMAGING — CR DG CHEST 2V
2 series · 2 of 2 positions shown · non-contrast
Comparison: None.

CLINICAL DATA: Cough and congestion

EXAM:
CHEST  2 VIEW

[w chest pa]
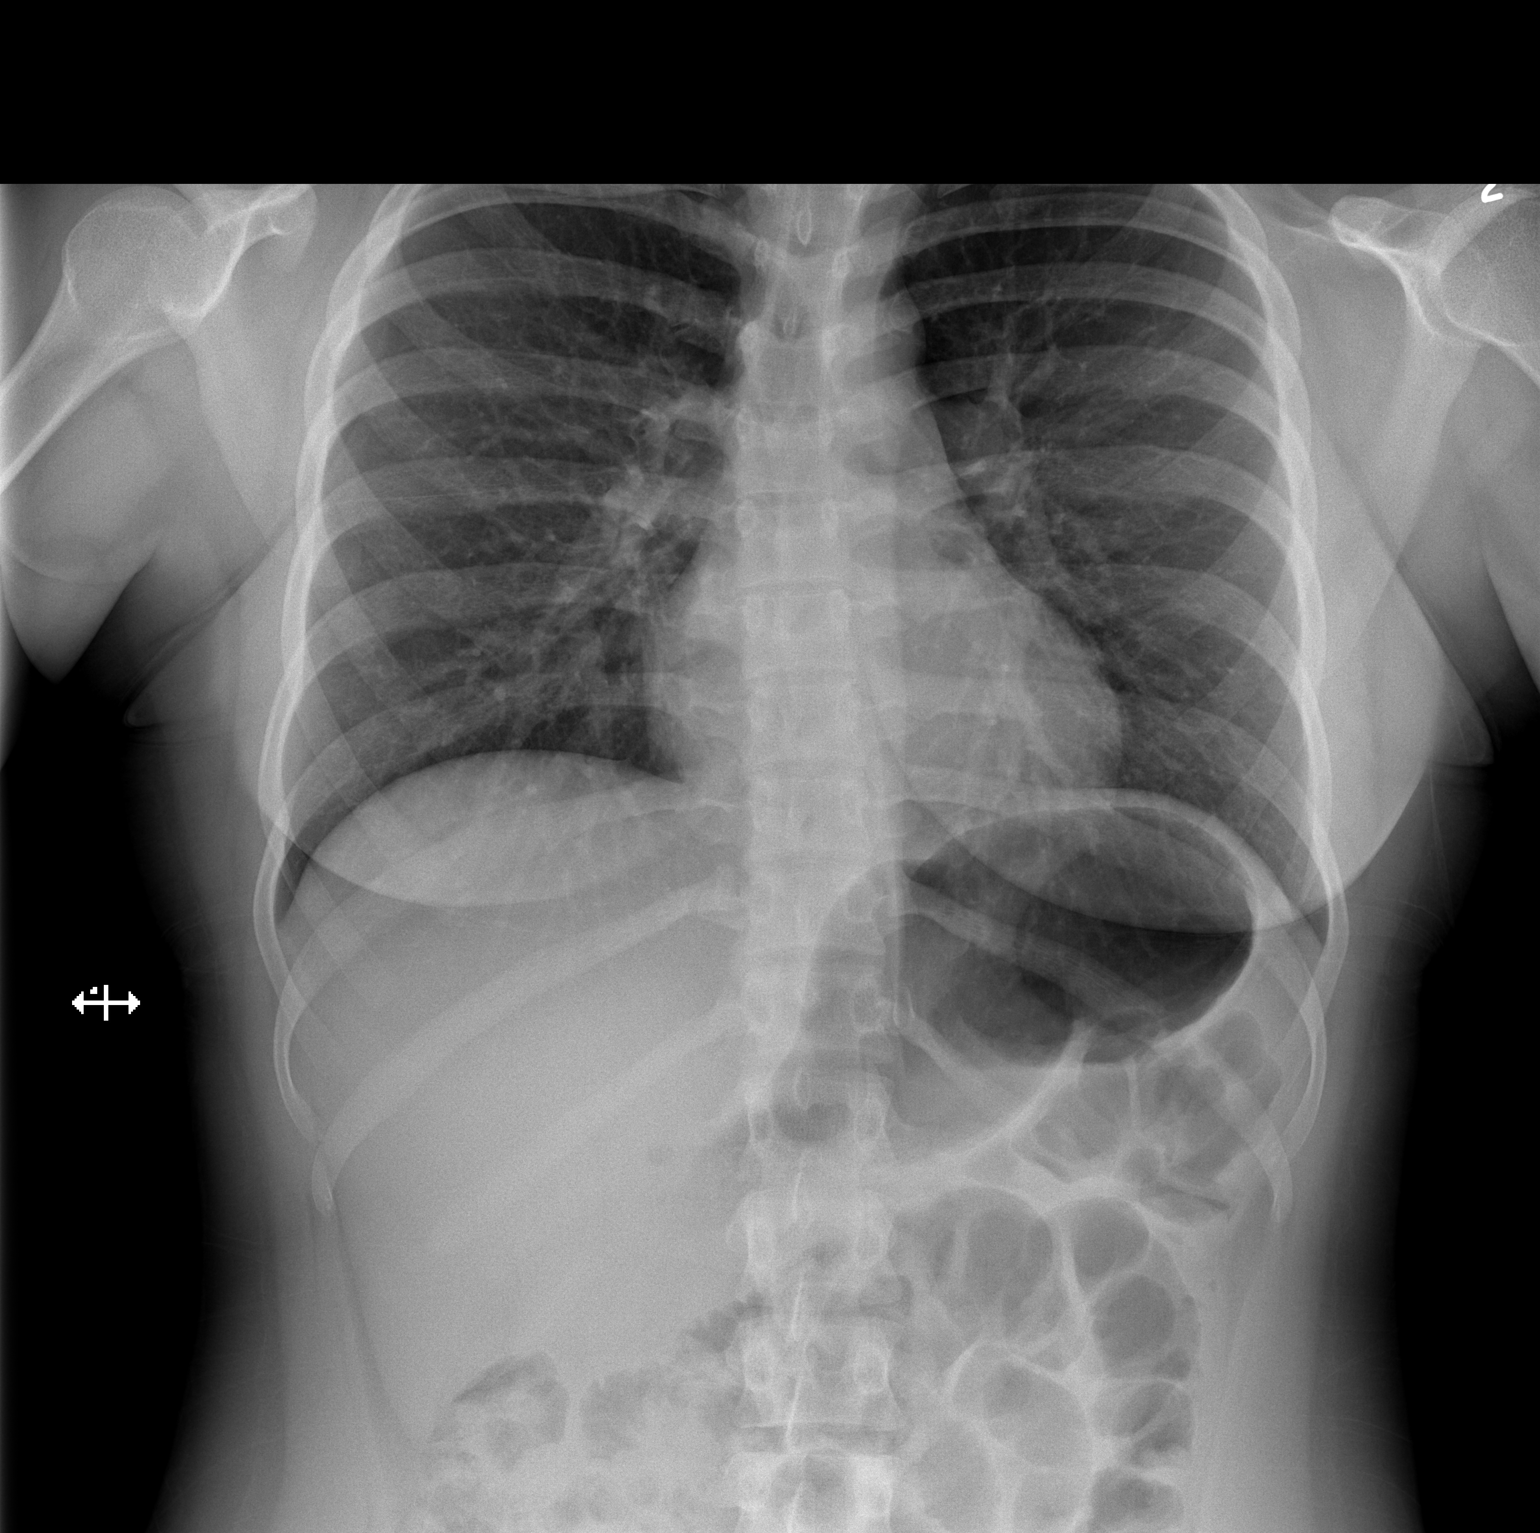

[w chest lat]
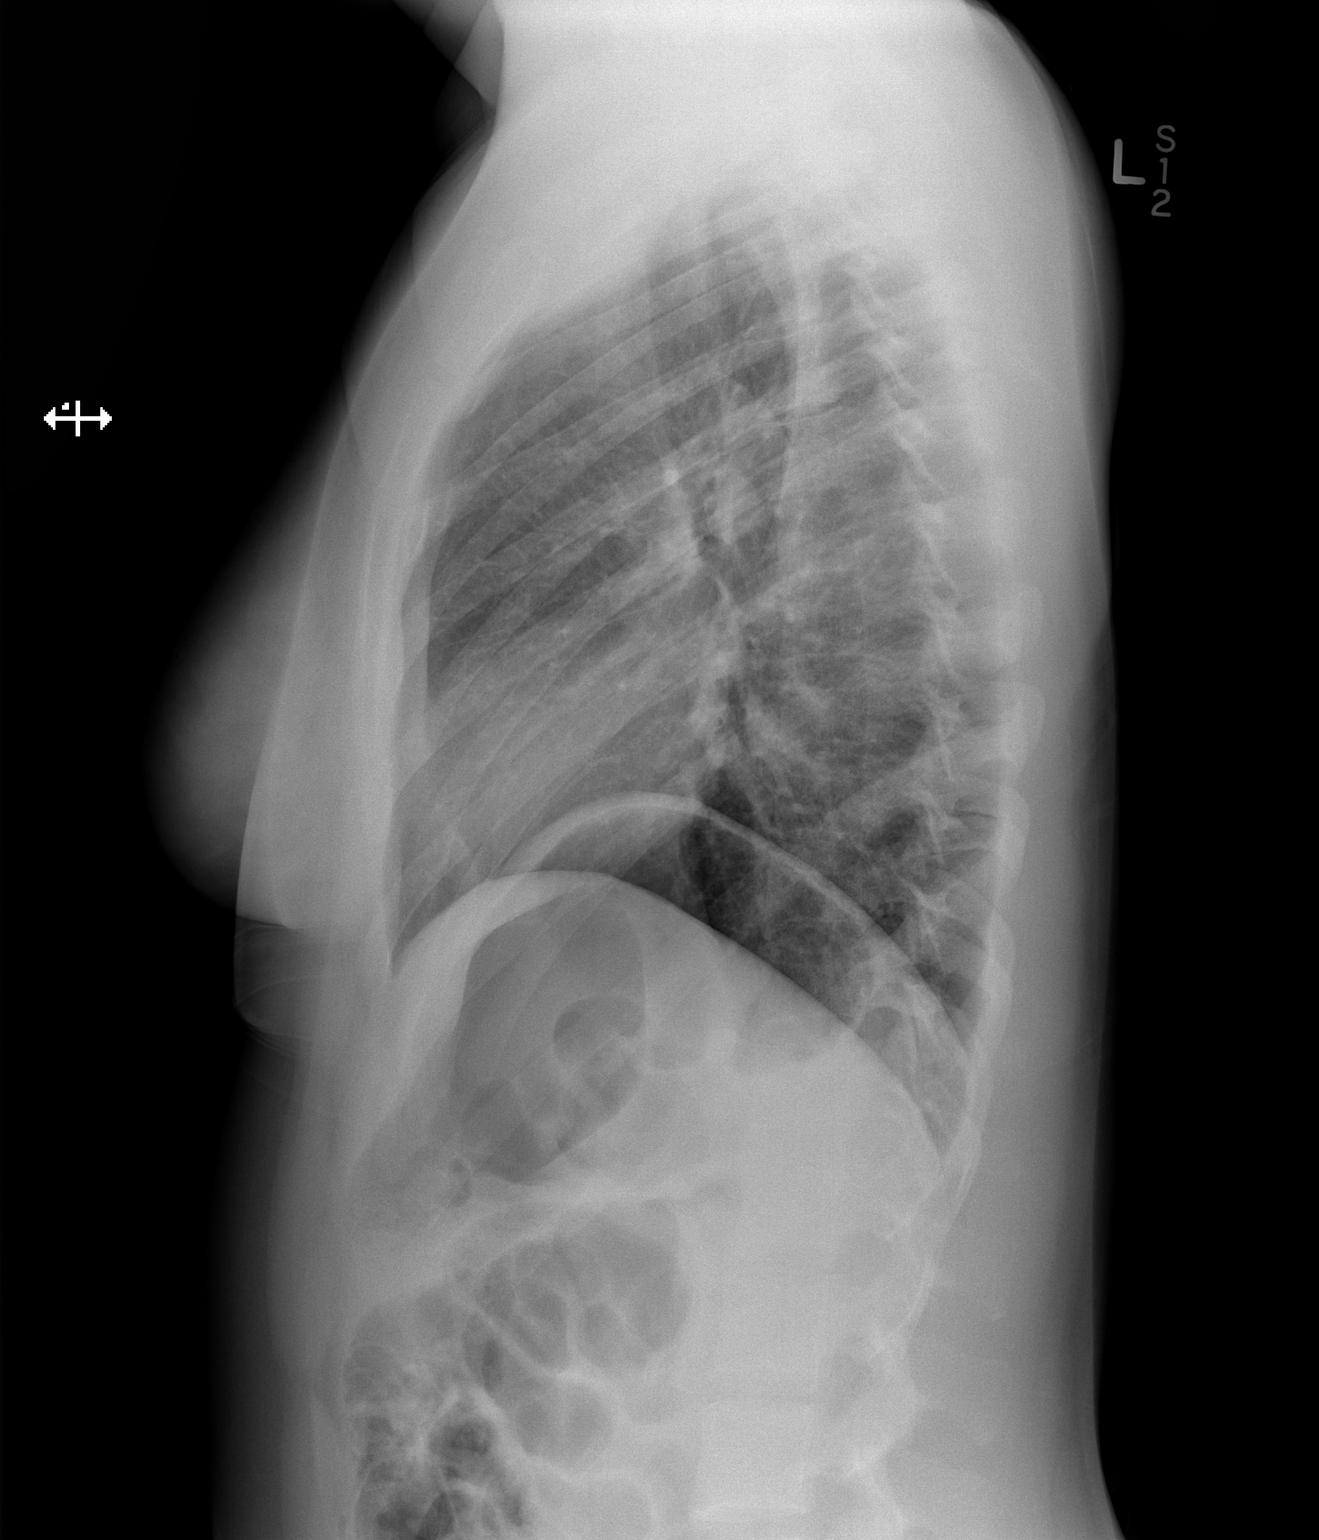

[2 of 2 positions shown; findings below may reference images not displayed]

FINDINGS: Lungs are clear. Heart size and pulmonary vascularity are normal. No
adenopathy. There is mild upper thoracic levoscoliosis.
IMPRESSION: No edema or consolidation.

## 2014-06-17 ENCOUNTER — Encounter (HOSPITAL_COMMUNITY): Payer: Self-pay | Admitting: Emergency Medicine

## 2014-06-17 ENCOUNTER — Emergency Department (HOSPITAL_COMMUNITY)
Admission: EM | Admit: 2014-06-17 | Discharge: 2014-06-17 | Disposition: A | Discharge status: Home / Self Care | Payer: Medicaid - Out of State | Attending: Emergency Medicine | Admitting: Emergency Medicine

## 2014-06-17 DIAGNOSIS — J45909 Unspecified asthma, uncomplicated: Secondary | ICD-10-CM | POA: Insufficient documentation

## 2014-06-17 DIAGNOSIS — R110 Nausea: Secondary | ICD-10-CM | POA: Diagnosis not present

## 2014-06-17 DIAGNOSIS — Z3202 Encounter for pregnancy test, result negative: Secondary | ICD-10-CM | POA: Diagnosis not present

## 2014-06-17 DIAGNOSIS — Z79899 Other long term (current) drug therapy: Secondary | ICD-10-CM | POA: Insufficient documentation

## 2014-06-17 DIAGNOSIS — R1030 Lower abdominal pain, unspecified: Secondary | ICD-10-CM

## 2014-06-17 DIAGNOSIS — N939 Abnormal uterine and vaginal bleeding, unspecified: Secondary | ICD-10-CM

## 2014-06-17 DIAGNOSIS — R102 Pelvic and perineal pain: Secondary | ICD-10-CM | POA: Insufficient documentation

## 2014-06-17 DIAGNOSIS — N390 Urinary tract infection, site not specified: Secondary | ICD-10-CM

## 2014-06-17 DIAGNOSIS — N898 Other specified noninflammatory disorders of vagina: Secondary | ICD-10-CM | POA: Insufficient documentation

## 2014-06-17 DIAGNOSIS — R109 Unspecified abdominal pain: Secondary | ICD-10-CM | POA: Diagnosis present

## 2014-06-17 LAB — WET PREP, GENITAL
Clue Cells Wet Prep HPF POC: NONE SEEN
Trich, Wet Prep: NONE SEEN
Yeast Wet Prep HPF POC: NONE SEEN

## 2014-06-17 LAB — CBC WITH DIFFERENTIAL/PLATELET
Basophils Absolute: 0 10*3/uL (ref 0.0–0.1)
Basophils Relative: 0 % (ref 0–1)
EOS ABS: 0.1 10*3/uL (ref 0.0–0.7)
EOS PCT: 1 % (ref 0–5)
HCT: 40.4 % (ref 36.0–46.0)
Hemoglobin: 13.9 g/dL (ref 12.0–15.0)
Lymphocytes Relative: 29 % (ref 12–46)
Lymphs Abs: 2.2 10*3/uL (ref 0.7–4.0)
MCH: 29.5 pg (ref 26.0–34.0)
MCHC: 34.4 g/dL (ref 30.0–36.0)
MCV: 85.8 fL (ref 78.0–100.0)
MONOS PCT: 3 % (ref 3–12)
Monocytes Absolute: 0.3 10*3/uL (ref 0.1–1.0)
Neutro Abs: 5 10*3/uL (ref 1.7–7.7)
Neutrophils Relative %: 67 % (ref 43–77)
PLATELETS: 330 10*3/uL (ref 150–400)
RBC: 4.71 MIL/uL (ref 3.87–5.11)
RDW: 12.3 % (ref 11.5–15.5)
WBC: 7.6 10*3/uL (ref 4.0–10.5)

## 2014-06-17 LAB — URINE MICROSCOPIC-ADD ON

## 2014-06-17 LAB — COMPREHENSIVE METABOLIC PANEL
ALT: 28 U/L (ref 0–35)
ANION GAP: 12 (ref 5–15)
AST: 26 U/L (ref 0–37)
Albumin: 4 g/dL (ref 3.5–5.2)
Alkaline Phosphatase: 80 U/L (ref 39–117)
BUN: 8 mg/dL (ref 6–23)
CALCIUM: 9.5 mg/dL (ref 8.4–10.5)
CO2: 24 mEq/L (ref 19–32)
Chloride: 102 mEq/L (ref 96–112)
Creatinine, Ser: 0.7 mg/dL (ref 0.50–1.10)
GFR calc Af Amer: >90 mL/min (ref >90)
GFR calc non Af Amer: >90 mL/min (ref >90)
GLUCOSE: 108 mg/dL — AB (ref 70–99)
Potassium: 4.1 mEq/L (ref 3.7–5.3)
SODIUM: 138 meq/L (ref 137–147)
TOTAL PROTEIN: 7.8 g/dL (ref 6.0–8.3)
Total Bilirubin: 0.6 mg/dL (ref 0.3–1.2)

## 2014-06-17 LAB — URINALYSIS, ROUTINE W REFLEX MICROSCOPIC
GLUCOSE, UA: NEGATIVE mg/dL
Ketones, ur: NEGATIVE mg/dL
Nitrite: NEGATIVE
PH: 6.5 (ref 5.0–8.0)
PROTEIN: 30 mg/dL — AB
SPECIFIC GRAVITY, URINE: 1.029 (ref 1.005–1.030)
Urobilinogen, UA: 1 mg/dL (ref 0.0–1.0)

## 2014-06-17 LAB — POC URINE PREG, ED: PREG TEST UR: NEGATIVE

## 2014-06-17 MED ORDER — CEPHALEXIN 500 MG PO CAPS: 500.0000 mg | ORAL_CAPSULE | Freq: Four times a day (QID) | ORAL | Status: AC

## 2014-06-17 MED ORDER — AZITHROMYCIN 250 MG PO TABS
1000.0000 mg | ORAL_TABLET | Freq: Once | ORAL | Status: AC
  Administered 2014-06-17: 1000 mg via ORAL
  Filled 2014-06-17: qty 4

## 2014-06-17 MED ORDER — IBUPROFEN 800 MG PO TABS: 800.0000 mg | ORAL_TABLET | Freq: Three times a day (TID) | ORAL | Status: AC

## 2014-06-17 MED ORDER — CEFTRIAXONE SODIUM 250 MG IJ SOLR
250.0000 mg | Freq: Once | INTRAMUSCULAR | Status: AC
  Administered 2014-06-17: 250 mg via INTRAMUSCULAR
  Filled 2014-06-17: qty 250

## 2014-06-17 MED ORDER — LIDOCAINE HCL 1 % IJ SOLN
INTRAMUSCULAR | Status: AC
  Filled 2014-06-17: qty 20

## 2014-06-17 NOTE — ED Notes (Signed)
Pt states she has been having abdominal pain.  She is on birth control and has had cycles closer together than normal and heavy bleeding.  With bleeding cycle, abdominal pain occuring.  Pt is nauseated with no vomiting.  Nausea throughout day.  No fever. No change in bowel and bladder.  Some brown discharge noted during brown set of pills in pill cycle.  No odor

## 2014-06-17 NOTE — Progress Notes (Signed)
  CARE MANAGEMENT ED NOTE 06/17/2014  Patient:  Melissa Acosta,Melissa Acosta   Account Number:  [id number]  Date Initiated:  06/17/2014  Documentation initiated by:  Edd ArbourGIBBS,KIMBERLY  Subjective/Objective Assessment:   23 yr old pt confirms with CM she still has Generic insurance coverage abdominal pain, on birth control and has had cycles closer together than normal and heavy bleeding. With bleeding cycle, abdominal pain occurring.  Pt is nauseated     Subjective/Objective Assessment Detail:   Pt states she would still see Eduardo OsierMelissa Sener and request a referral to an ob gyn as needed  Pt states she continues with insurance coverage (generic)  1 ED visit in last 6 months     Action/Plan:   CM unable to find that ED registration verified pt's insurance in EPI[employer] Inquired if changes in coverage and pcp Encouraged pt to request an ob gyn referral from her pcp   Action/Plan Detail:   Anticipated DC Date:  06/17/2014     Status Recommendation to Physician:   Result of Recommendation:    Other ED Services  Consult Working Plan    DC Planning Services  Other  Outpatient Services - Pt will follow up  PCP issues    Choice offered to / List presented to:            Status of service:  Completed, signed off  ED Comments:   ED Comments Detail:

## 2014-06-17 NOTE — ED Provider Notes (Signed)
CSN: [id number]     Arrival date & time 06/17/14  1113 History   First MD Initiated Contact with Patient 06/17/14 1236     Chief Complaint  Patient presents with  . Abdominal Pain  . Nausea     (Consider location/radiation/quality/duration/timing/severity/associated sxs/prior Treatment) HPI Comments: Patient is a 23 year old female with history of asthma who presents to the emergency department for evaluation of lower abdominal pain. She reports this pain has been intermittent over the past month. It is associated with intermittent vaginal bleeding. She is on OCP, but admits to not taking them as regularly as she should and sometimes forgetting a dose. The pain is a cramping pain, worse with her vaginal bleeding. She has some nausea without vomiting. When she is no bleeding she has some brown vaginal discharge. No dyspareunia or new sexual partners. She denies fevers, chills, diarrhea, dysuria.   Patient is a 23 y.o. female presenting with abdominal pain. The history is provided by the patient. No language interpreter was used.  Abdominal Pain Associated symptoms: nausea, vaginal bleeding and vaginal discharge   Associated symptoms: no chest pain, no chills, no diarrhea, no dysuria, no fever, no shortness of breath and no vomiting     Past Medical History  Diagnosis Date  . Asthma    History reviewed. No pertinent past surgical history. History reviewed. No pertinent family history. History  Substance Use Topics  . Smoking status: Never Smoker   . Smokeless tobacco: Not on file  . Alcohol Use: Yes     Comment: occ   OB History   Grav Para Term Preterm Abortions TAB SAB Ect Mult Living                 Review of Systems  Constitutional: Negative for fever and chills.  Respiratory: Negative for shortness of breath.   Cardiovascular: Negative for chest pain.  Gastrointestinal: Positive for nausea and abdominal pain. Negative for vomiting and diarrhea.  Genitourinary: Positive  for vaginal bleeding, vaginal discharge and pelvic pain. Negative for dysuria and dyspareunia.  All other systems reviewed and are negative.     Allergies  Review of patient's allergies indicates no known allergies.  Home Medications   Prior to Admission medications   Medication Sig Start Date End Date Taking? Authorizing Provider  albuterol (PROVENTIL HFA;VENTOLIN HFA) 108 (90 BASE) MCG/ACT inhaler Inhale 2 puffs into the lungs every 6 (six) hours as needed for wheezing or shortness of breath. Asthma   Yes Historical Provider, MD  Biotin 5000 MCG CAPS Take 5,000 mcg by mouth daily.   Yes Historical Provider, MD  Norethindrone-Ethinyl Estradiol-Fe Biphas (LO LOESTRIN FE) 1 MG-10 MCG / 10 MCG tablet Take 1 tablet by mouth daily.   Yes Historical Provider, MD   BP 136/94  Pulse 88  Temp(Src) 97.8 F (36.6 C) (Oral)  Resp 17  SpO2 100%  LMP 06/10/2014 Physical Exam  Nursing note and vitals reviewed. Constitutional: She is oriented to person, place, and time. She appears well-developed and well-nourished. No distress.  HENT:  Head: Normocephalic and atraumatic.  Right Ear: External ear normal.  Left Ear: External ear normal.  Nose: Nose normal.  Mouth/Throat: Oropharynx is clear and moist.  Eyes: Conjunctivae are normal.  Neck: Normal range of motion.  Cardiovascular: Normal rate, regular rhythm and normal heart sounds.   Pulmonary/Chest: Effort normal and breath sounds normal. No stridor. No respiratory distress. She has no wheezes. She has no rales.  Abdominal: Soft. She exhibits no distension. There  is tenderness in the suprapubic area. There is no rigidity, no rebound and no guarding.  Genitourinary: There is no rash, tenderness or lesion on the right labia. There is no rash, tenderness or lesion on the left labia. Uterus is tender. Cervix exhibits discharge. Cervix exhibits no motion tenderness and no friability. Right adnexum displays no mass and no tenderness. Left adnexum  displays no mass and no tenderness. No erythema around the vagina. No foreign body around the vagina. No signs of injury around the vagina. Vaginal discharge found.  Scant brown blood in vaginal vault.   Musculoskeletal: Normal range of motion.  Neurological: She is alert and oriented to person, place, and time. She has normal strength.  Skin: Skin is warm and dry. She is not diaphoretic. No erythema.  Psychiatric: She has a normal mood and affect. Her behavior is normal.    ED Course  Procedures (including critical care time) Labs Review Labs Reviewed  WET PREP, GENITAL - Abnormal; Notable for the following:    WBC, Wet Prep HPF POC MODERATE (*)    All other components within normal limits  URINALYSIS, ROUTINE W REFLEX MICROSCOPIC - Abnormal; Notable for the following:    Color, Urine AMBER (*)    APPearance TURBID (*)    Hgb urine dipstick LARGE (*)    Bilirubin Urine SMALL (*)    Protein, ur 30 (*)    Leukocytes, UA SMALL (*)    All other components within normal limits  COMPREHENSIVE METABOLIC PANEL - Abnormal; Notable for the following:    Glucose, Bld 108 (*)    All other components within normal limits  URINE MICROSCOPIC-ADD ON - Abnormal; Notable for the following:    Squamous Epithelial / LPF MANY (*)    Bacteria, UA MANY (*)    All other components within normal limits  GC/CHLAMYDIA PROBE AMP  CBC WITH DIFFERENTIAL  POC URINE PREG, ED    Imaging Review No results found.   EKG Interpretation None      MDM   Final diagnoses:  Lower abdominal pain  Vaginal bleeding  UTI (lower urinary tract infection)   Patient presents to ED for evaluation of lower abdominal pain with vaginal bleeding. Patient with small leukocytes, 11-20 WBC, will tx with Keflex. Patient is well appearing. Labs unremarkable. Patient encouraged to follow up with PCP and take OCP on a regular basis. Discussed reasons to return to ED immediately. Vital signs stable for discharge. Patient /  Family / Caregiver informed of clinical course, understand medical decision-making process, and agree with plan.   Mora BellmanHannah S Kalii Chesmore, PA-C 06/18/14 [phone]

## 2014-06-17 NOTE — Discharge Instructions (Signed)
Abdominal Pain, Women °Abdominal (stomach, pelvic, or belly) pain can be caused by many things. It is important to tell your doctor: °· The location of the pain. °· Does it come and go or is it present all the time? °· Are there things that start the pain (eating certain foods, exercise)? °· Are there other symptoms associated with the pain (fever, nausea, vomiting, diarrhea)? °All of this is helpful to know when trying to find the cause of the pain. °CAUSES  °· Stomach: virus or bacteria infection, or ulcer. °· Intestine: appendicitis (inflamed appendix), regional ileitis (Crohn's disease), ulcerative colitis (inflamed colon), irritable bowel syndrome, diverticulitis (inflamed diverticulum of the colon), or cancer of the stomach or intestine. °· Gallbladder disease or stones in the gallbladder. °· Kidney disease, kidney stones, or infection. °· Pancreas infection or cancer. °· Fibromyalgia (pain disorder). °· Diseases of the female organs: °¨ Uterus: fibroid (non-cancerous) tumors or infection. °¨ Fallopian tubes: infection or tubal pregnancy. °¨ Ovary: cysts or tumors. °¨ Pelvic adhesions (scar tissue). °¨ Endometriosis (uterus lining tissue growing in the pelvis and on the pelvic organs). °¨ Pelvic congestion syndrome (female organs filling up with blood just before the menstrual period). °¨ Pain with the menstrual period. °¨ Pain with ovulation (producing an egg). °¨ Pain with an IUD (intrauterine device, birth control) in the uterus. °¨ Cancer of the female organs. °· Functional pain (pain not caused by a disease, may improve without treatment). °· Psychological pain. °· Depression. °DIAGNOSIS  °Your doctor will decide the seriousness of your pain by doing an examination. °· Blood tests. °· X-rays. °· Ultrasound. °· CT scan (computed tomography, special type of X-ray). °· MRI (magnetic resonance imaging). °· Cultures, for infection. °· Barium enema (dye inserted in the large intestine, to better view it with  X-rays). °· Colonoscopy (looking in intestine with a lighted tube). °· Laparoscopy (minor surgery, looking in abdomen with a lighted tube). °· Major abdominal exploratory surgery (looking in abdomen with a large incision). °TREATMENT  °The treatment will depend on the cause of the pain.  °· Many cases can be observed and treated at home. °· Over-the-counter medicines recommended by your caregiver. °· Prescription medicine. °· Antibiotics, for infection. °· Birth control pills, for painful periods or for ovulation pain. °· Hormone treatment, for endometriosis. °· Nerve blocking injections. °· Physical therapy. °· Antidepressants. °· Counseling with a psychologist or psychiatrist. °· Minor or major surgery. °HOME CARE INSTRUCTIONS  °· Do not take laxatives, unless directed by your caregiver. °· Take over-the-counter pain medicine only if ordered by your caregiver. Do not take aspirin because it can cause an upset stomach or bleeding. °· Try a clear liquid diet (broth or water) as ordered by your caregiver. Slowly move to a bland diet, as tolerated, if the pain is related to the stomach or intestine. °· Have a thermometer and take your temperature several times a day, and record it. °· Bed rest and sleep, if it helps the pain. °· Avoid sexual intercourse, if it causes pain. °· Avoid stressful situations. °· Keep your follow-up appointments and tests, as your caregiver orders. °· If the pain does not go away with medicine or surgery, you may try: °¨ Acupuncture. °¨ Relaxation exercises (yoga, meditation). °¨ Group therapy. °¨ Counseling. °SEEK MEDICAL CARE IF:  °· You notice certain foods cause stomach pain. °· Your home care treatment is not helping your pain. °· You need stronger pain medicine. °· You want your IUD removed. °· You feel faint or   lightheaded. °· You develop nausea and vomiting. °· You develop a rash. °· You are having side effects or an allergy to your medicine. °SEEK IMMEDIATE MEDICAL CARE IF:  °· Your  pain does not go away or gets worse. °· You have a fever. °· Your pain is felt only in portions of the abdomen. The right side could possibly be appendicitis. The left lower portion of the abdomen could be colitis or diverticulitis. °· You are passing blood in your stools (bright red or black tarry stools, with or without vomiting). °· You have blood in your urine. °· You develop chills, with or without a fever. °· You pass out. °MAKE SURE YOU:  °· Understand these instructions. °· Will watch your condition. °· Will get help right away if you are not doing well or get worse. °Document Released: 06/05/2007 Document Revised: 12/23/2013 Document Reviewed: 06/25/2009 °ExitCare® Patient Information ©2015 [employer]. This information is not intended to replace advice given to you by your health care provider. Make sure you discuss any questions you have with your health care provider. ° °

## 2014-06-18 LAB — GC/CHLAMYDIA PROBE AMP
CT PROBE, AMP APTIMA: NEGATIVE
GC PROBE AMP APTIMA: NEGATIVE

## 2014-06-20 NOTE — ED Provider Notes (Signed)
Medical screening examination/treatment/procedure(s) were performed by non-physician practitioner and as supervising physician I was immediately available for consultation/collaboration.   EKG Interpretation None       Doug SouSam Firmin Belisle, MD 06/20/14 1546
